# Patient Record
Sex: Male | Born: 1970 | Race: White | Hispanic: No | Marital: Single | Smoking: Never smoker
Health system: Southern US, Community
[De-identification: ages and names within clinical notes are randomized; demographics above are authoritative.]

## PROBLEM LIST (undated history)

## (undated) DIAGNOSIS — M109 Gout, unspecified: Secondary | ICD-10-CM

## (undated) DIAGNOSIS — M1990 Unspecified osteoarthritis, unspecified site: Secondary | ICD-10-CM

---

## 2013-10-26 ENCOUNTER — Emergency Department (HOSPITAL_COMMUNITY): Payer: Medicare Other

## 2013-10-26 ENCOUNTER — Emergency Department (HOSPITAL_COMMUNITY)
Admission: EM | Admit: 2013-10-26 | Discharge: 2013-10-27 | Disposition: A | Discharge status: Home / Self Care | Payer: Medicare Other | Attending: Emergency Medicine | Admitting: Emergency Medicine

## 2013-10-26 ENCOUNTER — Encounter (HOSPITAL_COMMUNITY): Payer: Self-pay | Admitting: Emergency Medicine

## 2013-10-26 DIAGNOSIS — Z79899 Other long term (current) drug therapy: Secondary | ICD-10-CM | POA: Insufficient documentation

## 2013-10-26 DIAGNOSIS — IMO0002 Reserved for concepts with insufficient information to code with codable children: Secondary | ICD-10-CM | POA: Diagnosis not present

## 2013-10-26 DIAGNOSIS — S52502A Unspecified fracture of the lower end of left radius, initial encounter for closed fracture: Secondary | ICD-10-CM

## 2013-10-26 DIAGNOSIS — Y9389 Activity, other specified: Secondary | ICD-10-CM | POA: Diagnosis not present

## 2013-10-26 DIAGNOSIS — Y929 Unspecified place or not applicable: Secondary | ICD-10-CM | POA: Diagnosis not present

## 2013-10-26 DIAGNOSIS — Z791 Long term (current) use of non-steroidal anti-inflammatories (NSAID): Secondary | ICD-10-CM | POA: Insufficient documentation

## 2013-10-26 DIAGNOSIS — M109 Gout, unspecified: Secondary | ICD-10-CM | POA: Diagnosis not present

## 2013-10-26 DIAGNOSIS — R296 Repeated falls: Secondary | ICD-10-CM | POA: Diagnosis not present

## 2013-10-26 DIAGNOSIS — S32029A Unspecified fracture of second lumbar vertebra, initial encounter for closed fracture: Secondary | ICD-10-CM

## 2013-10-26 DIAGNOSIS — S52599A Other fractures of lower end of unspecified radius, initial encounter for closed fracture: Secondary | ICD-10-CM | POA: Diagnosis not present

## 2013-10-26 HISTORY — DX: Gout, unspecified: M10.9

## 2013-10-26 HISTORY — DX: Unspecified osteoarthritis, unspecified site: M19.90

## 2013-10-26 LAB — CBC WITH DIFFERENTIAL/PLATELET
BASOS PCT: 0 % (ref 0–1)
Basophils Absolute: 0 10*3/uL (ref 0.0–0.1)
EOS ABS: 0 10*3/uL (ref 0.0–0.7)
EOS PCT: 0 % (ref 0–5)
HCT: 42.9 % (ref 39.0–52.0)
Hemoglobin: 14.9 g/dL (ref 13.0–17.0)
LYMPHS ABS: 1.1 10*3/uL (ref 0.7–4.0)
Lymphocytes Relative: 7 % — ABNORMAL LOW (ref 12–46)
MCH: 30.4 pg (ref 26.0–34.0)
MCHC: 34.7 g/dL (ref 30.0–36.0)
MCV: 87.6 fL (ref 78.0–100.0)
MONOS PCT: 6 % (ref 3–12)
Monocytes Absolute: 1 10*3/uL (ref 0.1–1.0)
Neutro Abs: 14 10*3/uL — ABNORMAL HIGH (ref 1.7–7.7)
Neutrophils Relative %: 87 % — ABNORMAL HIGH (ref 43–77)
Platelets: 231 10*3/uL (ref 150–400)
RBC: 4.9 MIL/uL (ref 4.22–5.81)
RDW: 12.4 % (ref 11.5–15.5)
WBC: 16.1 10*3/uL — ABNORMAL HIGH (ref 4.0–10.5)

## 2013-10-26 LAB — BASIC METABOLIC PANEL
Anion gap: 14 (ref 5–15)
BUN: 6 mg/dL (ref 6–23)
CO2: 25 mEq/L (ref 19–32)
CREATININE: 0.68 mg/dL (ref 0.50–1.35)
Calcium: 9.1 mg/dL (ref 8.4–10.5)
Chloride: 99 mEq/L (ref 96–112)
GFR calc Af Amer: >90 mL/min (ref >90)
GFR calc non Af Amer: >90 mL/min (ref >90)
GLUCOSE: 108 mg/dL — AB (ref 70–99)
POTASSIUM: 3.9 meq/L (ref 3.7–5.3)
Sodium: 138 mEq/L (ref 137–147)

## 2013-10-26 MED ORDER — OXYCODONE-ACETAMINOPHEN 5-325 MG PO TABS: 2.0000 | ORAL_TABLET | ORAL | Status: AC | PRN

## 2013-10-26 MED ORDER — HYDROMORPHONE HCL 1 MG/ML IJ SOLN
1.0000 mg | Freq: Once | INTRAMUSCULAR | Status: AC
Start: 2013-10-26 — End: 2013-10-26
  Administered 2013-10-26: 1 mg via INTRAMUSCULAR
  Filled 2013-10-26: qty 1

## 2013-10-26 MED ORDER — HYDROMORPHONE HCL 1 MG/ML IJ SOLN
1.0000 mg | Freq: Once | INTRAMUSCULAR | Status: AC
  Administered 2013-10-26: 1 mg via INTRAVENOUS
  Filled 2013-10-26: qty 1

## 2013-10-26 MED ORDER — SODIUM CHLORIDE 0.9 % IV BOLUS (SEPSIS)
1000.0000 mL | Freq: Once | INTRAVENOUS | Status: AC
  Administered 2013-10-26: 1000 mL via INTRAVENOUS

## 2013-10-26 MED ORDER — ONDANSETRON HCL 4 MG/2ML IJ SOLN
4.0000 mg | Freq: Once | INTRAMUSCULAR | Status: AC
  Administered 2013-10-26: 4 mg via INTRAVENOUS
  Filled 2013-10-26: qty 2

## 2013-10-26 MED ORDER — KETOROLAC TROMETHAMINE 30 MG/ML IJ SOLN
30.0000 mg | Freq: Once | INTRAMUSCULAR | Status: AC
  Administered 2013-10-26: 30 mg via INTRAVENOUS
  Filled 2013-10-26: qty 1

## 2013-10-26 MED ORDER — HYDROMORPHONE HCL 1 MG/ML IJ SOLN
0.5000 mg | Freq: Once | INTRAMUSCULAR | Status: AC
  Administered 2013-10-27: 0.5 mg via INTRAVENOUS
  Filled 2013-10-26: qty 1

## 2013-10-26 NOTE — Discharge Instructions (Signed)
You have fractured your wrist.   Splint, ice, elevate, pain medication, followup with hand surgeon in [place]. His name has been provided to you. They will call you tomorrow for an appt.  . Additionally you have fractured your lumbar 2 vertebrae. Recommend buying a back support at a medical supply store. Pain medication. Return here for any concerns.

## 2013-10-26 NOTE — ED Notes (Signed)
Went in to do rounding and vitals. Patient was up walking around stated he is in a lot of pain and unable to sit due to the pain. Made nurse and doctor aware.

## 2013-10-26 NOTE — ED Provider Notes (Addendum)
CSN: [id number]     Arrival date & time 10/26/13  2046 History  This chart was scribed for Donnetta Hutching, MD, by Yevette Edwards, ED Scribe. This patient was seen in room APA01/APA01 and the patient's care was started at 8:56 PM.  None    Chief Complaint  Patient presents with  . Fall     The history is provided by the patient. No language interpreter was used.   HPI Comments: Level V caveat for urgent need for intervention. Jaime Saunders is a 43 y.o. male who presents to the Emergency Department complaining of falling from an approximately 16 foot deer stand. He states he landed upon his back with his backpack cushioning the fall. He denies head impact. The pt complains of pain to his right left and his lumbar back. Mr. Beckom denies neck pain, pain to his other extremities, or abdominal pain.   Past Medical History  Diagnosis Date  . Arthritis   . Gout    History reviewed. No pertinent past surgical history. History reviewed. No pertinent family history. History  Substance Use Topics  . Smoking status: Never Smoker   . Smokeless tobacco: Not on file  . Alcohol Use: Yes    Review of Systems  Unable to perform ROS: Acuity of condition    A complete 10 system review of systems was obtained, and all systems were negative except where indicated in the HPI and PE.    Allergies  Review of patient's allergies indicates no known allergies.  Home Medications   Prior to Admission medications   Medication Sig Start Date End Date Taking? Authorizing Provider  febuxostat (ULORIC) 40 MG tablet Take 40 mg by mouth 2 (two) times daily.   Yes Historical Provider, MD  naproxen (NAPROSYN) 500 MG tablet Take 500 mg by mouth 2 (two) times daily with a meal.   Yes Historical Provider, MD  oxyCODONE (ROXICODONE) 15 MG immediate release tablet Take 15 mg by mouth every 4 (four) hours as needed for pain.   Yes Historical Provider, MD  predniSONE (DELTASONE) 10 MG tablet Take 10 mg by mouth  daily as needed (for wrist pain).   Yes Historical Provider, MD  oxyCODONE-acetaminophen (PERCOCET) 5-325 MG per tablet Take 2 tablets by mouth every 4 (four) hours as needed. 10/26/13   Donnetta Hutching, MD  oxyCODONE-acetaminophen (PERCOCET) 5-325 MG per tablet Take 2 tablets by mouth every 4 (four) hours as needed. 10/26/13   Donnetta Hutching, MD   Triage Vitals: BP 136/86  Pulse 89  Temp(Src) 98.5 F (36.9 C) (Oral)  Resp 18  Ht 6' (1.829 m)  Wt 206 lb (93.441 kg)  BMI 27.93 kg/m2  SpO2 100%  Physical Exam  Nursing note and vitals reviewed. Constitutional: He is oriented to person, place, and time. He appears well-developed and well-nourished.  HENT:  Head: Normocephalic and atraumatic.  Eyes: Conjunctivae and EOM are normal. Pupils are equal, round, and reactive to light.  Neck: Normal range of motion. Neck supple.  Cardiovascular: Normal rate, regular rhythm and normal heart sounds.   Pulmonary/Chest: Effort normal and breath sounds normal.  Abdominal: Soft. Bowel sounds are normal.  Musculoskeletal: He exhibits tenderness.  Tenderness to left wrist and lower back.   Neurological: He is alert and oriented to person, place, and time.  Skin: Skin is warm and dry.  Psychiatric: He has a normal mood and affect. His behavior is normal.    ED Course  Procedures (including critical care time)  DIAGNOSTIC STUDIES: Oxygen  Saturation is 100% on room air, normal by my interpretation.    COORDINATION OF CARE:  9:02 PM- Discussed treatment plan with patient, and the patient agreed to the plan. The plan includes imaging.   Labs Review Labs Reviewed  BASIC METABOLIC PANEL - Abnormal; Notable for the following:    Glucose, Bld 108 (*)    All other components within normal limits  CBC WITH DIFFERENTIAL - Abnormal; Notable for the following:    WBC 16.1 (*)    Neutrophils Relative % 87 (*)    Neutro Abs 14.0 (*)    Lymphocytes Relative 7 (*)    All other components within normal limits     Imaging Review Dg Lumbar Spine Complete  10/26/2013   CLINICAL DATA:  Fall with pain.  EXAM: LUMBAR SPINE - COMPLETE 4+ VIEW  COMPARISON:  None.  FINDINGS: There is irregularity and compression of the upper L2 body. Height loss is minimal, less than 20%. No evidence of posterior cortex involvement.  There is mild wedging of the T12 vertebral body without evidence of cortical disruption, favoring physiologic remodeling.  Mild spondylotic endplate change in the mid lumbar spine, without notable disc narrowing. No subluxation.  IMPRESSION: 1. L2 superior endplate fracture with mild depression. 2. T12 vertebral body wedging; favor physiologic remodeling over fracture.   Electronically Signed   By: Tiburcio Pea M.D.   On: 10/26/2013 22:07   Dg Wrist Complete Left  10/26/2013   CLINICAL DATA:  Fall from deer stand.  Wrist pain  EXAM: LEFT WRIST - COMPLETE 3+ VIEW  COMPARISON:  None.  FINDINGS: Comminuted fracture of the distal radial metaphysis with extension towards the epiphysis posteriorly, but no offset along the articular surface. There is dorsal displacement and impaction causing dorsal tilting of the wrist. Radial inclination is relatively preserved. Avulsion fracture of the ulnar styloid process. Radiocarpal alignment maintained.  IMPRESSION: 1. Displaced distal radius fracture, as above. 2. Ulnar styloid process fracture.   Electronically Signed   By: Tiburcio Pea M.D.   On: 10/26/2013 22:04   Ct Lumbar Spine Wo Contrast  10/26/2013   CLINICAL DATA:  Larey Seat from deer stand complaining of low back pain  EXAM: CT LUMBAR SPINE WITHOUT CONTRAST  TECHNIQUE: Multidetector CT imaging of the lumbar spine was performed without intravenous contrast administration. Multiplanar CT image reconstructions were also generated.  COMPARISON:  Radiographs performed earlier the [facility]  FINDINGS: There is mild acute compression deformity of the superior L2 endplate. Superior endplate is depressed by about 3 mm. Fracture  line extends to involve anterior osteophyte which is mildly detached. There is mild degenerative disc disease at L1-2, L2-3, and L3-4. Minimal L4-5 spondylosis present. There are no other fractures. No evidence of retropulsion at L2. As described on report of radiograph performed earlier this evening, there is mild anterior wedging of T12 without evidence of fracture line. This is not associated with a fracture line and suggests remodeling.  Mild L5-S1 facet arthropathy.  IMPRESSION: Mild compression deformity superior endplate L2.   Electronically Signed   By: Esperanza Heir M.D.   On: 10/26/2013 23:08     EKG Interpretation None      MDM   Final diagnoses:  Radius distal fracture, left, closed, initial encounter  L2 vertebral fracture, closed, initial encounter    No head or neck trauma.  No neurological deficits. Patient has fractured his left radius and left ulna. Discussed with Dr. Mack Hook hand surgeon. Splint, sling, ice, elevate.  He will  followup as outpatient. Discussed  lumbar fracture with patient. Will recommend a back brace. Discharge medications Percocet. I personally performed the services described in this documentation, which was scribed in my presence. The recorded information has been reviewed and is accurate.    Donnetta Hutching, MD 10/26/13 2359  Donnetta Hutching, MD 10/27/13 0001

## 2013-10-26 NOTE — ED Notes (Signed)
Fell app 16 feet out of deer stand, Deformity of lt wrist, No LOC, Back pain.

## 2013-10-27 DIAGNOSIS — IMO0002 Reserved for concepts with insufficient information to code with codable children: Secondary | ICD-10-CM | POA: Diagnosis not present

## 2013-10-27 NOTE — ED Notes (Signed)
Pt was given pre-pack on Percocet 5-325 mg,  6 pills, given instructions on use, pt verbally understands.

## 2013-11-02 MED FILL — Oxycodone w/ Acetaminophen Tab 5-325 MG: ORAL | Qty: 6 | Status: AC

## 2016-05-27 IMAGING — CT CT L SPINE W/O CM
3 of 10 series · 12 of 33 positions shown, 14 images · non-contrast
Comparison: Radiographs performed earlier the [facility]

CLINICAL DATA: Fell from [facility] complaining of low back pain

EXAM:
CT LUMBAR SPINE WITHOUT CONTRAST
TECHNIQUE: Multidetector CT imaging of the lumbar spine was performed without
intravenous contrast administration. Multiplanar CT image
reconstructions were also generated.

[Series 3: lumbar spine 2.0 b30s · axial · 0.31mm/px · z∈[-224,-24]mm · 4 of 151 slices shown, 5 images]
[im 26/151  soft-tissue]
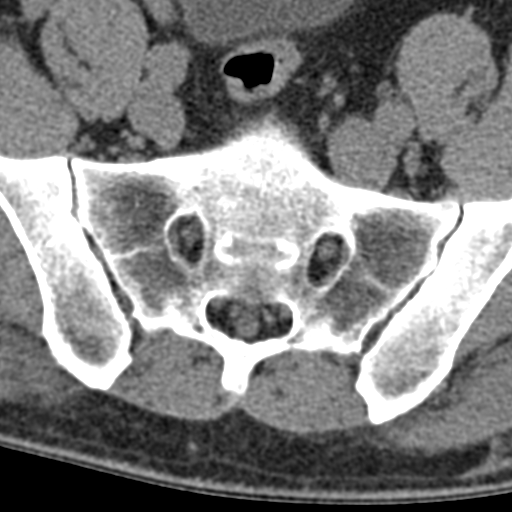
[im 26/151  bone]
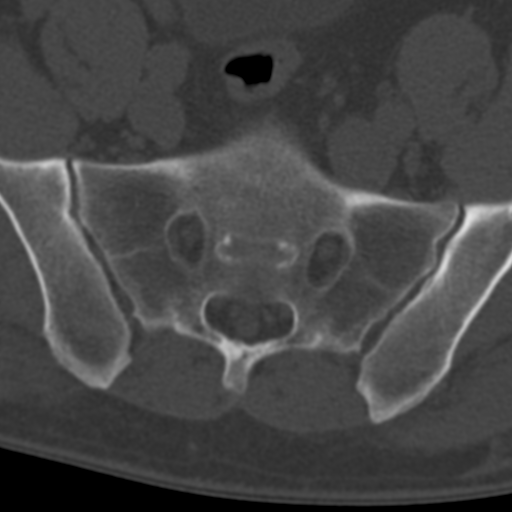
[im 51/151  bone]
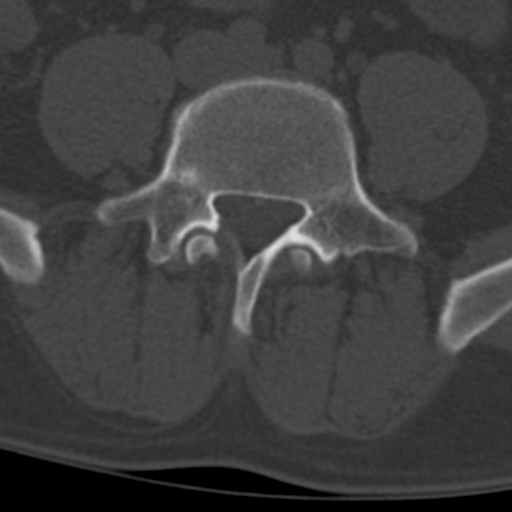
[im 101/151  bone]
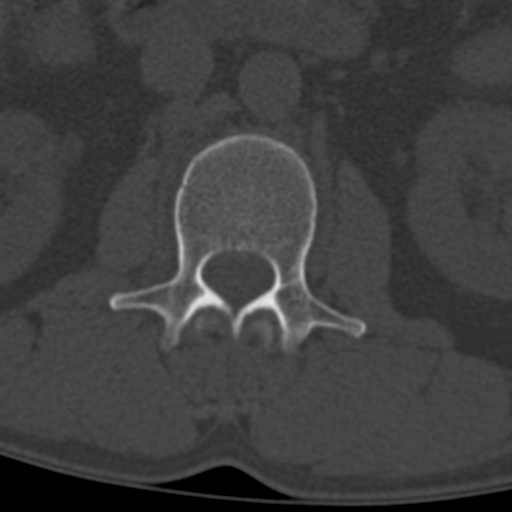
[im 126/151  bone]
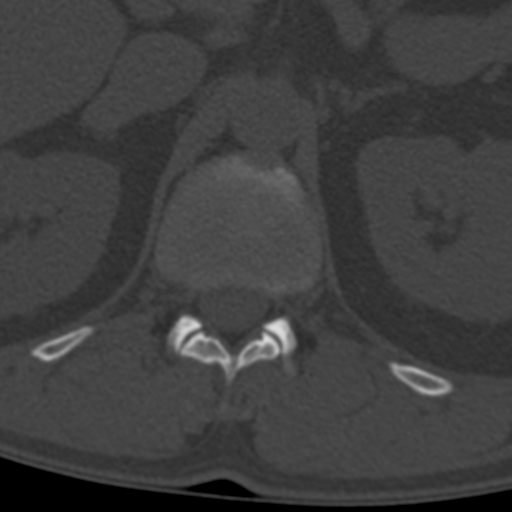

[Series 4: lumbar spine 2.0 spo cor · coronal · 0.42mm/px · 3 of 79 slices shown]
[im 16/79  bone]
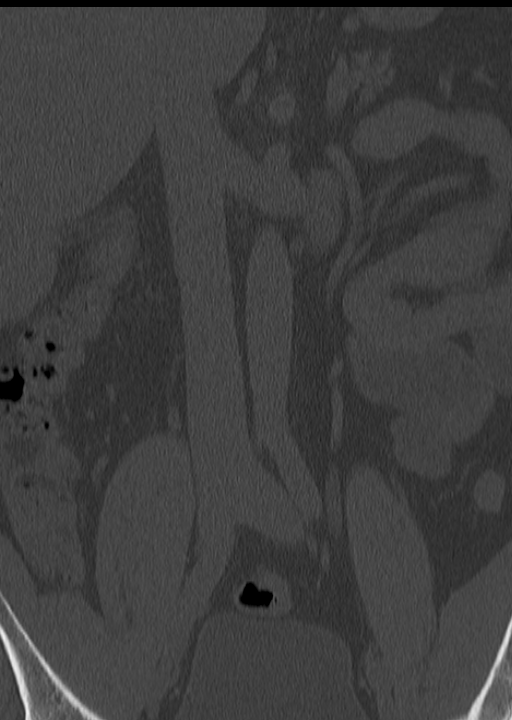
[im 32/79  bone]
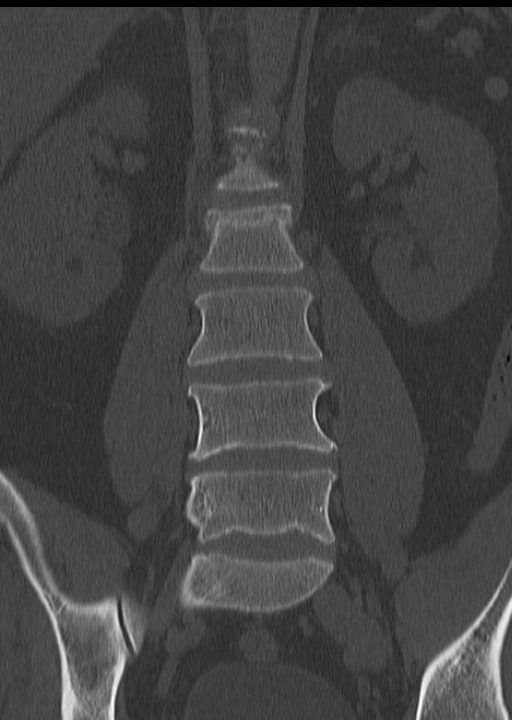
[im 47/79  bone]
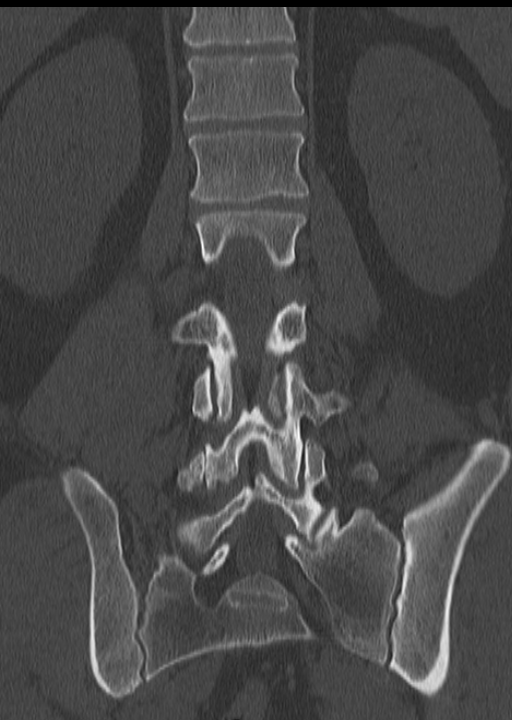

[Series 5: lumbar spine 2.0 spo · sagittal · 0.33mm/px · 5 of 77 slices shown, 6 images]
[im 26/77  bone]
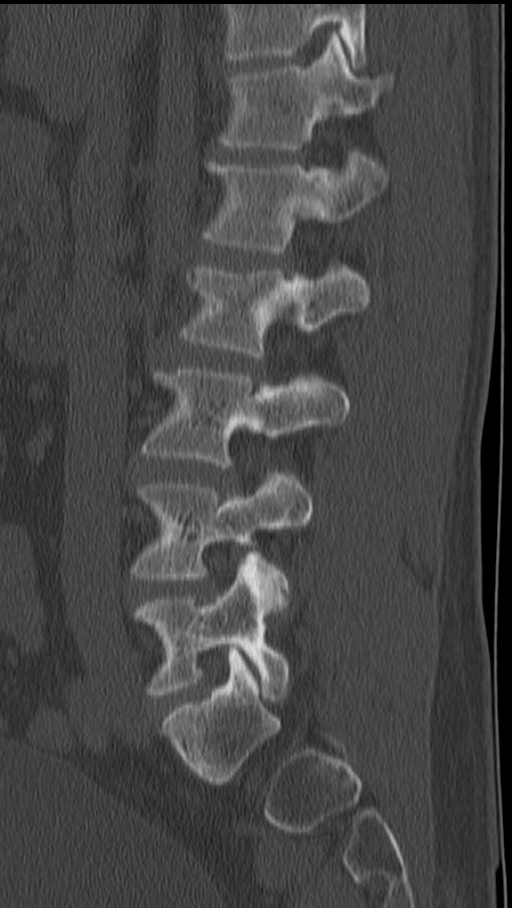
[im 32/77  bone]
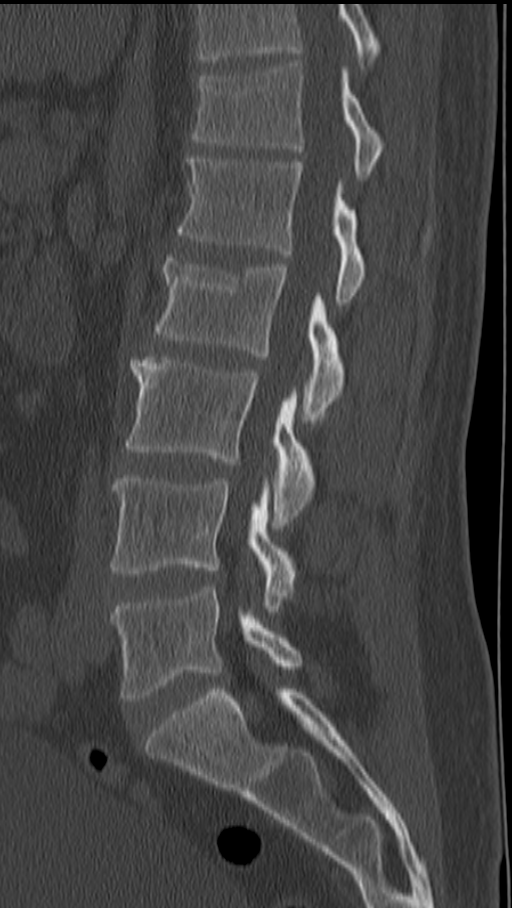
[im 39/77  soft-tissue]
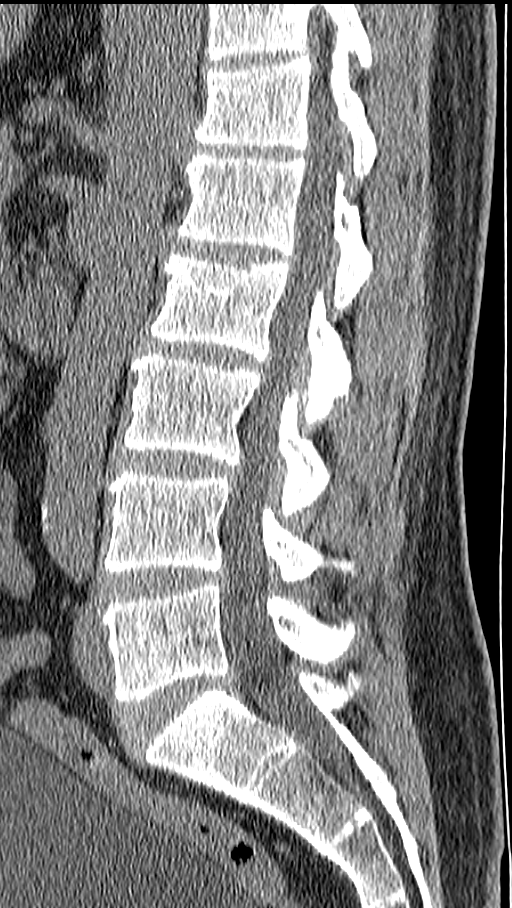
[im 39/77  bone]
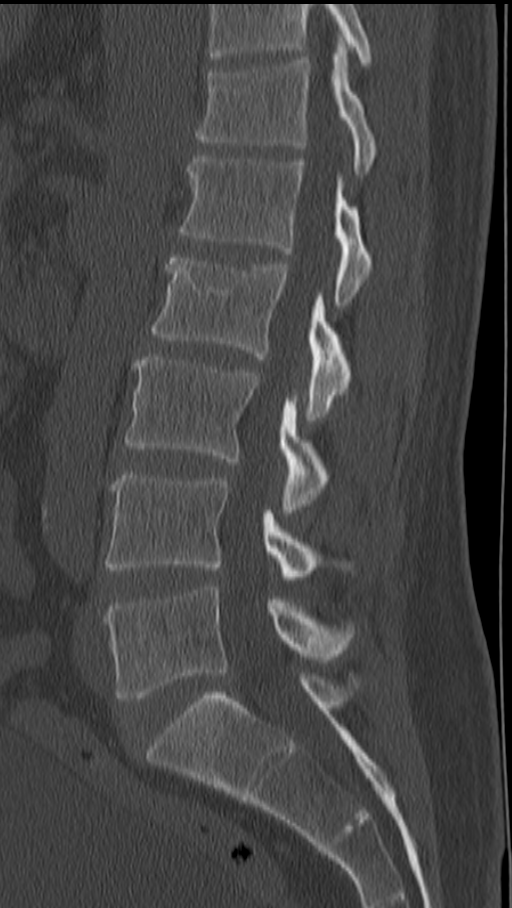
[im 45/77  bone]
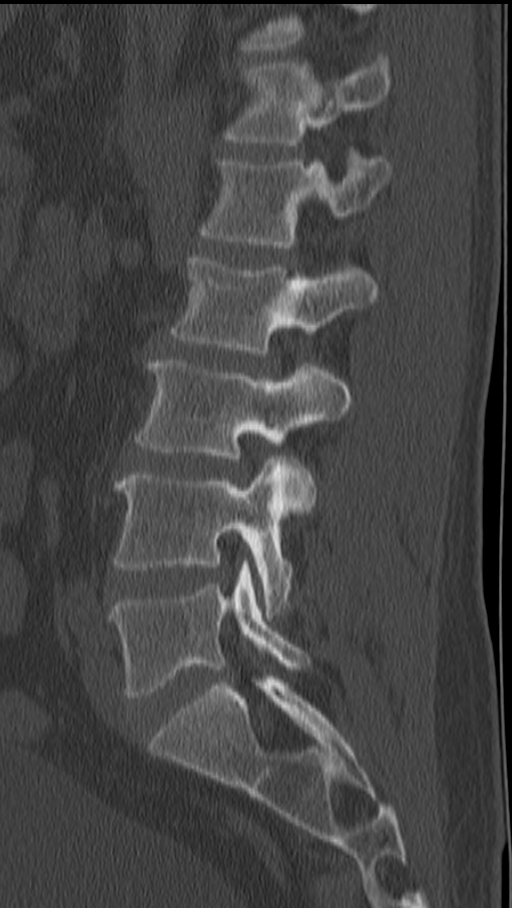
[im 51/77  bone]
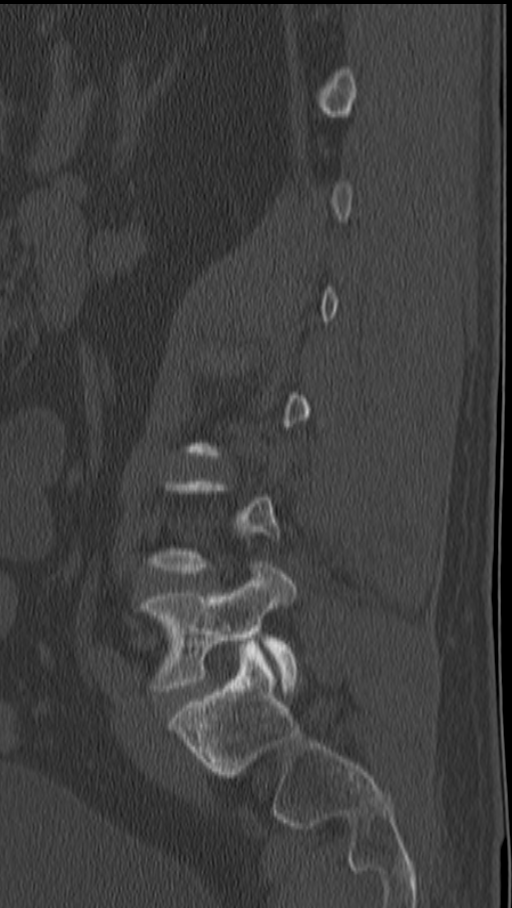

[12 of 33 positions shown; findings below may reference images not displayed]

FINDINGS: There is mild acute compression deformity of the superior L2
endplate. Superior endplate is depressed by about 3 mm. Fracture
line extends to involve anterior osteophyte which is mildly
detached. There is mild degenerative disc disease at L1-2, L2-3, and
L3-4. Minimal L4-5 spondylosis present. There are no other
fractures. No evidence of retropulsion at L2. As described on report
of radiograph performed earlier this evening, there is mild anterior
wedging of T12 without evidence of fracture line. This is not
associated with a fracture line and suggests remodeling.

Mild L5-S1 facet arthropathy.
IMPRESSION: Mild compression deformity superior endplate L2.
